# Patient Record
Sex: Female | Born: 1945 | Race: Black or African American | Hispanic: No | Marital: Single | Smoking: Never smoker
Health system: Southern US, Community
[De-identification: ages and names within clinical notes are randomized; demographics above are authoritative.]

## PROBLEM LIST (undated history)

## (undated) DIAGNOSIS — F0390 Unspecified dementia without behavioral disturbance: Secondary | ICD-10-CM

## (undated) DIAGNOSIS — F329 Major depressive disorder, single episode, unspecified: Secondary | ICD-10-CM

## (undated) DIAGNOSIS — I10 Essential (primary) hypertension: Secondary | ICD-10-CM

## (undated) DIAGNOSIS — E119 Type 2 diabetes mellitus without complications: Secondary | ICD-10-CM

## (undated) DIAGNOSIS — E785 Hyperlipidemia, unspecified: Secondary | ICD-10-CM

## (undated) HISTORY — PX: CYSTECTOMY: SUR359

## (undated) HISTORY — PX: ABDOMINAL HYSTERECTOMY: SHX81

---

## 2009-11-25 ENCOUNTER — Emergency Department (HOSPITAL_COMMUNITY): Admission: EM | Admit: 2009-11-25 | Discharge: 2009-11-26 | Payer: Self-pay | Admitting: Emergency Medicine

## 2009-12-06 ENCOUNTER — Emergency Department (HOSPITAL_COMMUNITY): Admission: EM | Admit: 2009-12-06 | Discharge: 2009-12-06 | Payer: Self-pay | Admitting: Emergency Medicine

## 2011-01-07 LAB — DIFFERENTIAL
Eosinophils Absolute: 0 10*3/uL (ref 0.0–0.7)
Monocytes Relative: 6 % (ref 3–12)
Neutro Abs: 4.2 10*3/uL (ref 1.7–7.7)
Neutrophils Relative %: 47 % (ref 43–77)

## 2011-01-07 LAB — URINE MICROSCOPIC-ADD ON

## 2011-01-07 LAB — URINALYSIS, ROUTINE W REFLEX MICROSCOPIC
Glucose, UA: NEGATIVE mg/dL
Hgb urine dipstick: NEGATIVE
Ketones, ur: 15 mg/dL — AB
Nitrite: NEGATIVE
Protein, ur: NEGATIVE mg/dL
Urobilinogen, UA: 1 mg/dL (ref 0.0–1.0)

## 2011-01-07 LAB — CBC
Hemoglobin: 14.1 g/dL (ref 12.0–15.0)
Platelets: 310 10*3/uL (ref 150–400)
RBC: 4.32 MIL/uL (ref 3.87–5.11)

## 2011-01-07 LAB — BASIC METABOLIC PANEL
BUN: 6 mg/dL (ref 6–23)
Chloride: 107 mEq/L (ref 96–112)
Potassium: 3.3 mEq/L — ABNORMAL LOW (ref 3.5–5.1)

## 2011-01-07 LAB — URINE CULTURE: Colony Count: 60000

## 2011-01-07 LAB — ETHANOL: Alcohol, Ethyl (B): <5 mg/dL (ref 0–10)

## 2011-05-25 IMAGING — CR DG CHEST 2V
2 series · 2 of 2 positions shown · non-contrast
Comparison: None

CLINICAL DATA: Altered mental status.

CHEST - 2 VIEW

[w chest pa]
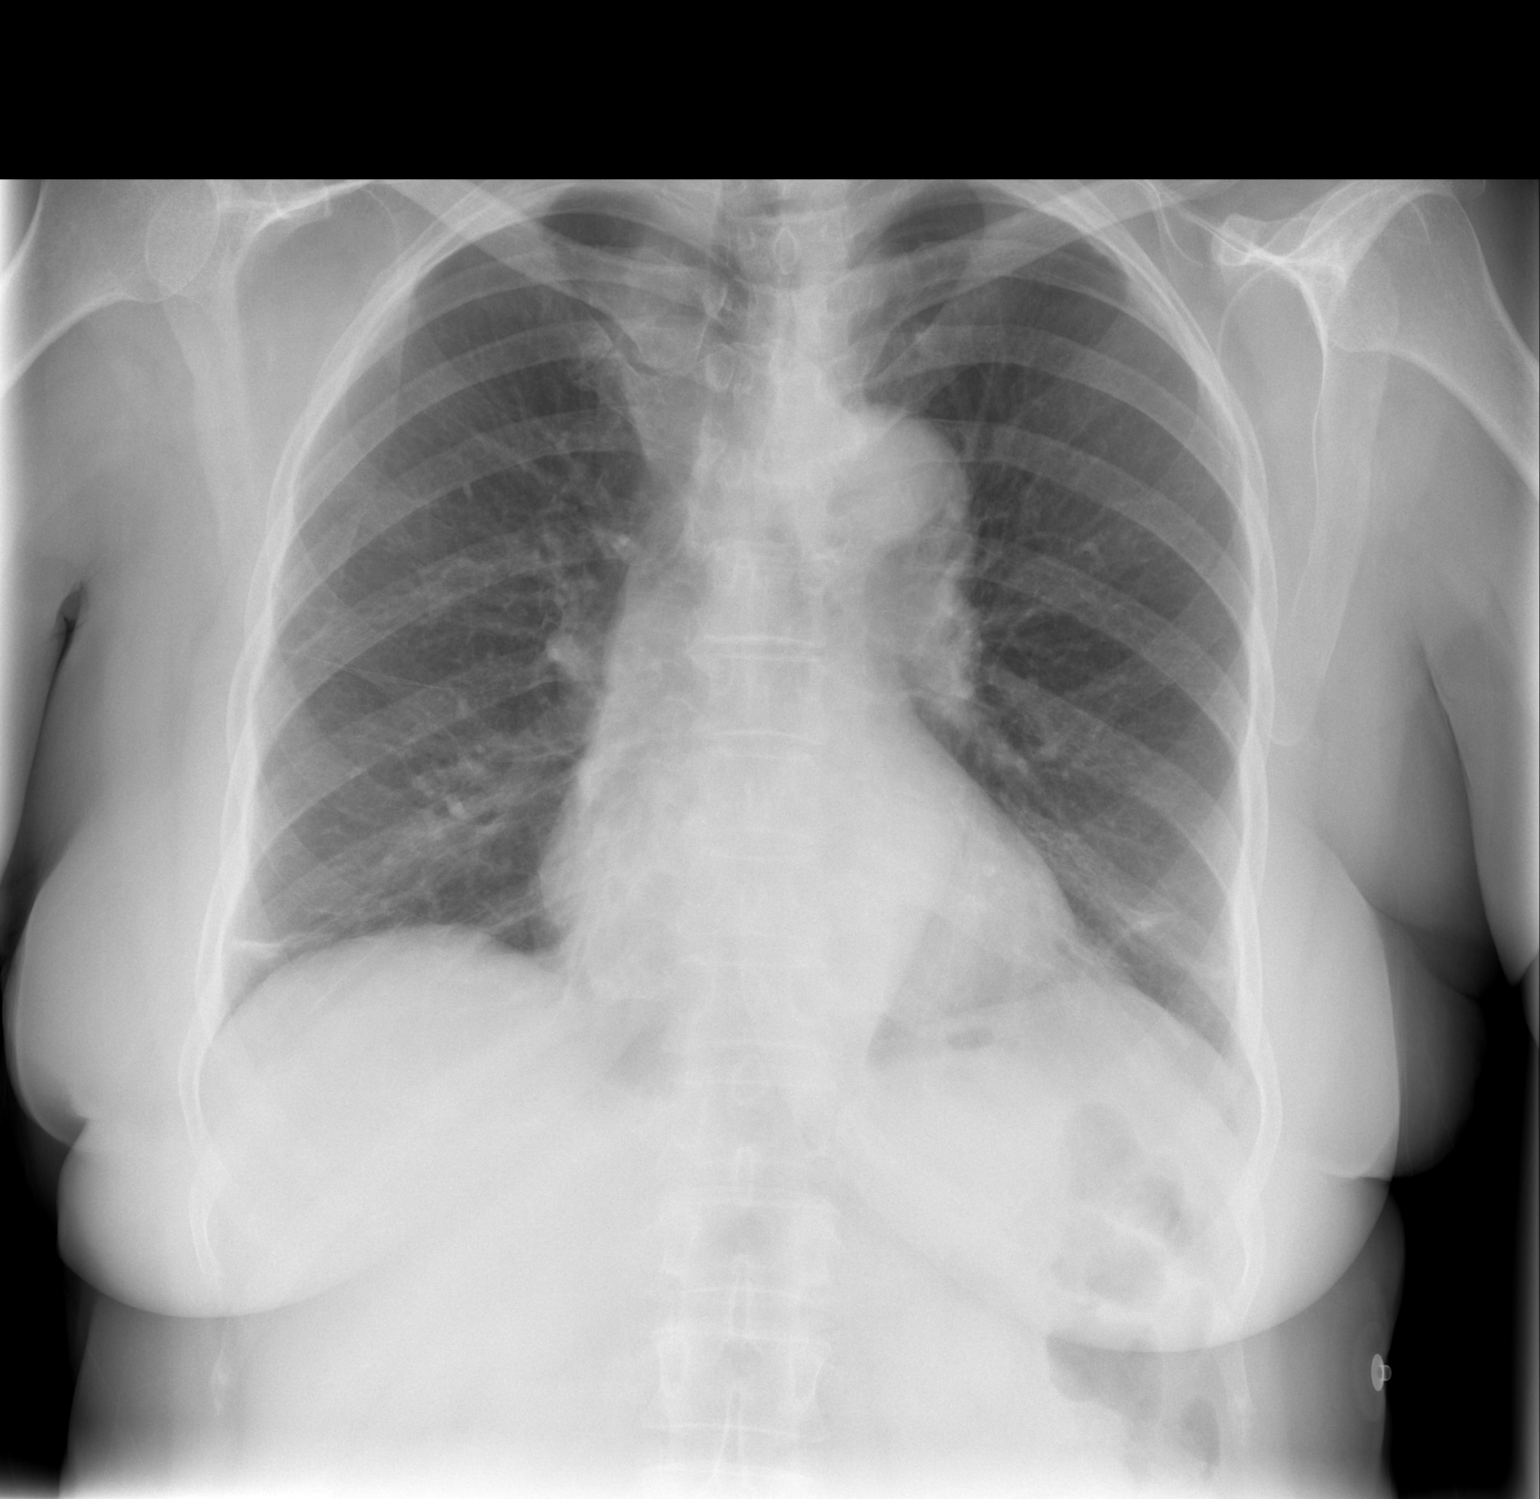

[w chest lat]
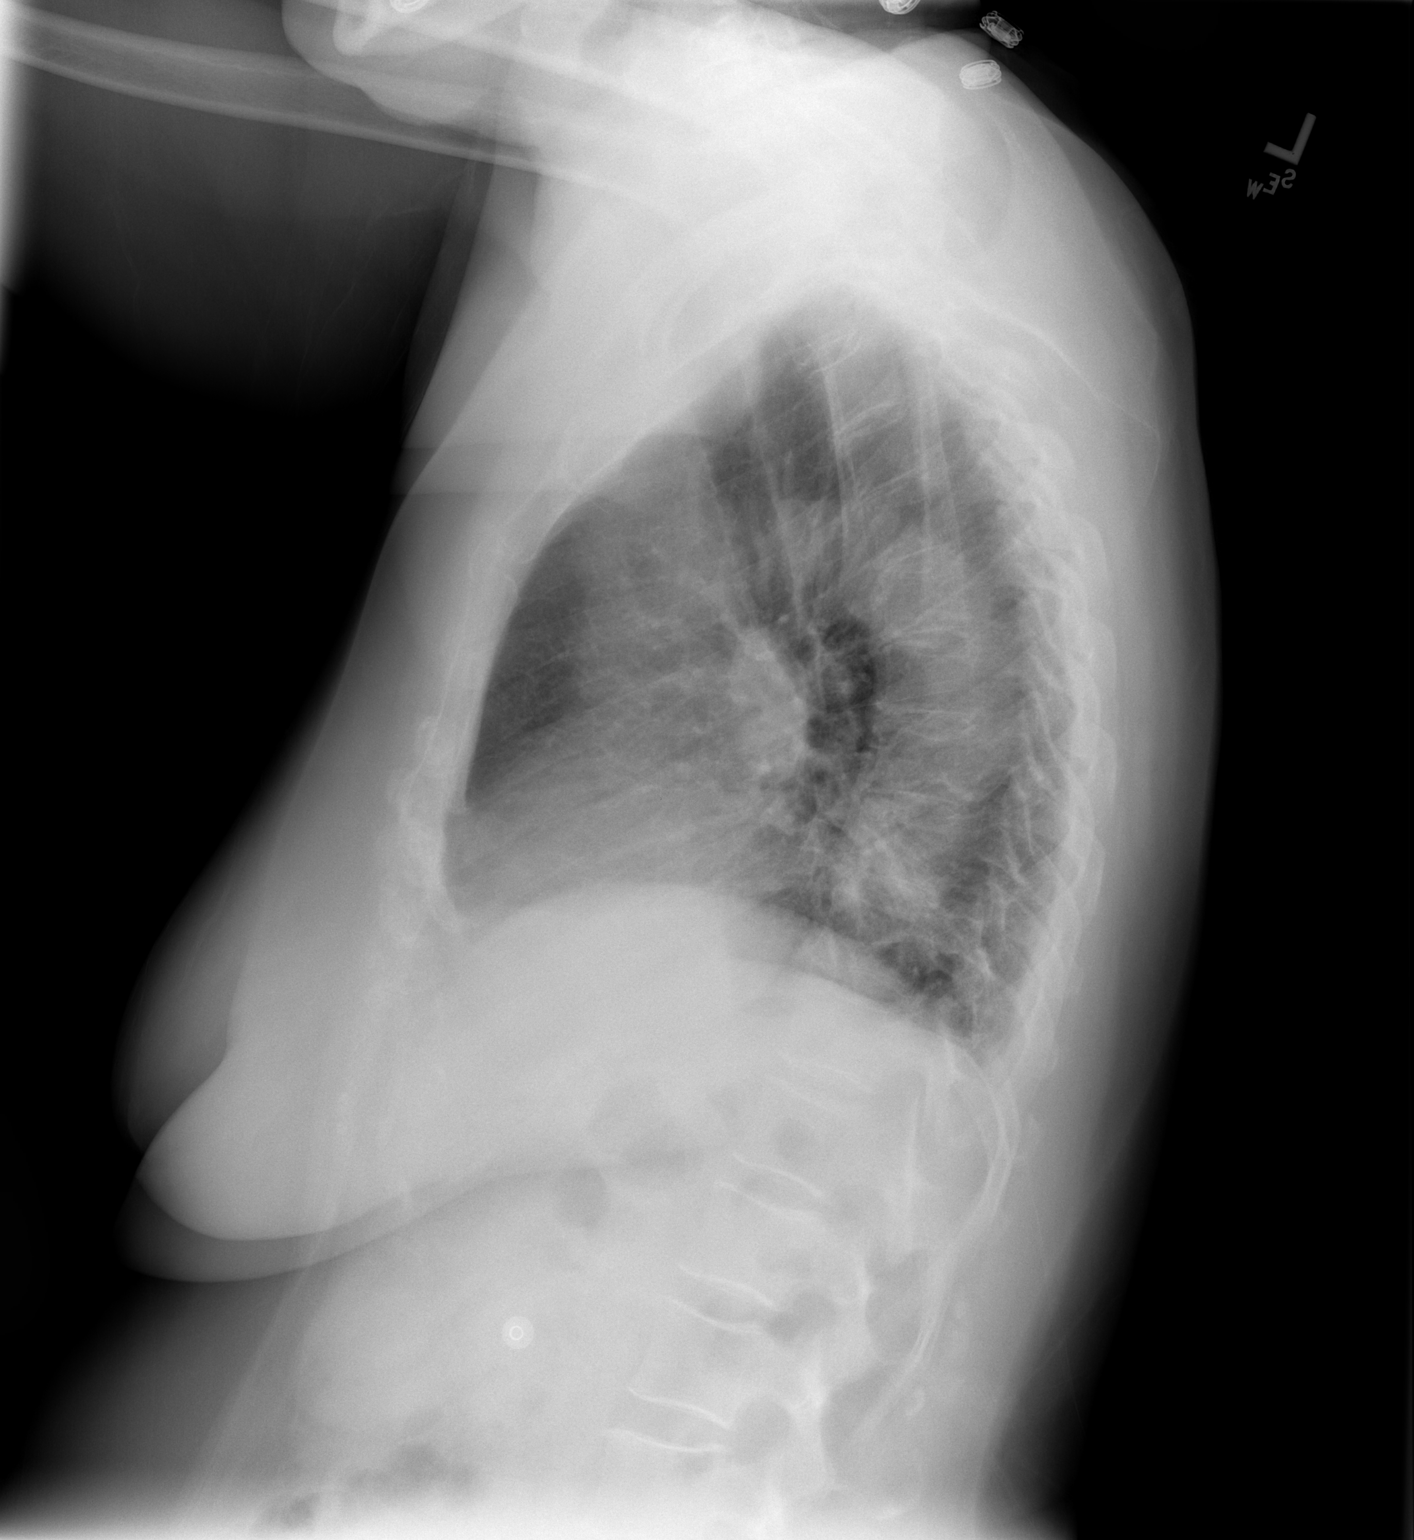

[2 of 2 positions shown; findings below may reference images not displayed]

FINDINGS: The lungs are well-aerated.  Mild bibasilar atelectasis
or scarring is noted.  There is no evidence of focal opacification,
pleural effusion or pneumothorax.

The heart is normal in size; the mediastinal contour is within
normal limits.  No acute osseous abnormalities are seen.
IMPRESSION: Mild bibasilar atelectasis or scarring noted.

## 2011-10-29 ENCOUNTER — Other Ambulatory Visit: Payer: Self-pay | Admitting: Family Medicine

## 2011-10-29 DIAGNOSIS — Z1231 Encounter for screening mammogram for malignant neoplasm of breast: Secondary | ICD-10-CM

## 2011-12-01 ENCOUNTER — Ambulatory Visit
Admission: RE | Admit: 2011-12-01 | Discharge: 2011-12-01 | Disposition: A | Discharge status: Home / Self Care | Payer: Medicare Other | Source: Ambulatory Visit | Attending: Family Medicine | Admitting: Family Medicine

## 2011-12-01 DIAGNOSIS — Z1231 Encounter for screening mammogram for malignant neoplasm of breast: Secondary | ICD-10-CM

## 2013-01-02 ENCOUNTER — Encounter (HOSPITAL_COMMUNITY): Payer: Self-pay | Admitting: Nurse Practitioner

## 2013-01-02 ENCOUNTER — Emergency Department (HOSPITAL_COMMUNITY)
Admission: EM | Admit: 2013-01-02 | Discharge: 2013-01-03 | Disposition: A | Discharge status: Skilled Nursing Facility | Payer: Medicare Other | Attending: Emergency Medicine | Admitting: Emergency Medicine

## 2013-01-02 DIAGNOSIS — R002 Palpitations: Secondary | ICD-10-CM | POA: Insufficient documentation

## 2013-01-02 DIAGNOSIS — Z79899 Other long term (current) drug therapy: Secondary | ICD-10-CM | POA: Insufficient documentation

## 2013-01-02 DIAGNOSIS — R739 Hyperglycemia, unspecified: Secondary | ICD-10-CM

## 2013-01-02 DIAGNOSIS — R110 Nausea: Secondary | ICD-10-CM | POA: Insufficient documentation

## 2013-01-02 DIAGNOSIS — F0280 Dementia in other diseases classified elsewhere without behavioral disturbance: Secondary | ICD-10-CM | POA: Insufficient documentation

## 2013-01-02 DIAGNOSIS — E1169 Type 2 diabetes mellitus with other specified complication: Secondary | ICD-10-CM | POA: Insufficient documentation

## 2013-01-02 DIAGNOSIS — E785 Hyperlipidemia, unspecified: Secondary | ICD-10-CM | POA: Insufficient documentation

## 2013-01-02 DIAGNOSIS — Z8659 Personal history of other mental and behavioral disorders: Secondary | ICD-10-CM | POA: Insufficient documentation

## 2013-01-02 DIAGNOSIS — Z7902 Long term (current) use of antithrombotics/antiplatelets: Secondary | ICD-10-CM | POA: Insufficient documentation

## 2013-01-02 DIAGNOSIS — G309 Alzheimer's disease, unspecified: Secondary | ICD-10-CM | POA: Insufficient documentation

## 2013-01-02 HISTORY — DX: Type 2 diabetes mellitus without complications: E11.9

## 2013-01-02 HISTORY — DX: Major depressive disorder, single episode, unspecified: F32.9

## 2013-01-02 HISTORY — DX: Hyperlipidemia, unspecified: E78.5

## 2013-01-02 HISTORY — DX: Unspecified dementia, unspecified severity, without behavioral disturbance, psychotic disturbance, mood disturbance, and anxiety: F03.90

## 2013-01-02 LAB — CBC
HCT: 41.9 % (ref 36.0–46.0)
Hemoglobin: 15.5 g/dL — ABNORMAL HIGH (ref 12.0–15.0)
MCH: 32.6 pg (ref 26.0–34.0)
MCHC: 37 g/dL — ABNORMAL HIGH (ref 30.0–36.0)
MCV: 88 fL (ref 78.0–100.0)
Platelets: 284 10*3/uL (ref 150–400)
RBC: 4.76 MIL/uL (ref 3.87–5.11)
RDW: 12.8 % (ref 11.5–15.5)
WBC: 9 10*3/uL (ref 4.0–10.5)

## 2013-01-02 LAB — BASIC METABOLIC PANEL
BUN: 10 mg/dL (ref 6–23)
CO2: 23 mEq/L (ref 19–32)
Calcium: 9.4 mg/dL (ref 8.4–10.5)
Chloride: 100 mEq/L (ref 96–112)
Creatinine, Ser: 0.77 mg/dL (ref 0.50–1.10)
GFR calc Af Amer: >90 mL/min (ref >90)
GFR calc non Af Amer: 87 mL/min — ABNORMAL LOW (ref >90)
Glucose, Bld: 257 mg/dL — ABNORMAL HIGH (ref 70–99)
Potassium: 3.6 mEq/L (ref 3.5–5.1)
Sodium: 139 mEq/L (ref 135–145)

## 2013-01-02 LAB — GLUCOSE, CAPILLARY: Glucose-Capillary: 229 mg/dL — ABNORMAL HIGH (ref 70–99)

## 2013-01-02 LAB — TROPONIN I: Troponin I: <0.3 ng/mL (ref <0.30)

## 2013-01-02 MED ORDER — METFORMIN HCL 500 MG PO TABS
500.0000 mg | ORAL_TABLET | Freq: Once | ORAL | Status: AC
  Administered 2013-01-02: 500 mg via ORAL
  Filled 2013-01-02: qty 1

## 2013-01-02 MED ORDER — METFORMIN HCL 500 MG PO TABS: 500.0000 mg | ORAL_TABLET | Freq: Two times a day (BID) | ORAL | Status: DC

## 2013-01-02 MED ORDER — SODIUM CHLORIDE 0.9 % IV BOLUS (SEPSIS)
1000.0000 mL | Freq: Once | INTRAVENOUS | Status: AC
  Administered 2013-01-02: 1000 mL via INTRAVENOUS

## 2013-01-02 NOTE — ED Notes (Signed)
[facility], states she has been having palpitations, last time earlier today.  She states she does not have them at this time.  Patient denies any shortness of breath, but is having pain in right neck, shoulder and arm.  Patient is CAOx3.

## 2013-01-02 NOTE — ED Notes (Signed)
Pt waiting on PTAR 

## 2013-01-02 NOTE — ED Provider Notes (Signed)
History     CSN: [id number]  Arrival date & time 01/02/13  1949   First MD Initiated Contact with Patient 01/02/13 2017      Chief Complaint  Patient presents with  . Palpitations    (Consider location/radiation/quality/duration/timing/severity/associated sxs/prior treatment) Patient is a 67 y.o. female presenting with palpitations.  Palpitations  This is a recurrent problem. Episode onset: 4 days ago. The problem occurs constantly ("let off" for a little while today, but then worsened again). The problem has not changed since onset.Associated with: "ever since my doctor took away my metformin" Associated symptoms include nausea ("my stomach has been torn up"). Pertinent negatives include no diaphoresis, no fever, no chest pain, no abdominal pain, no vomiting, no cough and no shortness of breath. She has tried nothing for the symptoms.    Past Medical History  Diagnosis Date  . Major depressive disorder   . 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 Hyperlipidemia   . Diabetes     type 2, uncomplicated    Past Surgical History  Procedure Laterality Date  . Cesarean section    . Abdominal hysterectomy    . Cystectomy Left     axillae    No family history on file.  History  Substance Use Topics  . Smoking status: Never Smoker   . Smokeless tobacco: Not on file  . Alcohol Use: No    OB History   Grav Para Term Preterm Abortions TAB SAB Ect Mult Living                  Review of Systems  Constitutional: Negative for fever and diaphoresis.  HENT: Negative for congestion.   Respiratory: Negative for cough and shortness of breath.   Cardiovascular: Positive for palpitations. Negative for chest pain.  Gastrointestinal: Positive for nausea ("my stomach has been torn up"). Negative for vomiting, abdominal pain and diarrhea.  Genitourinary: Negative for difficulty urinating.  All other systems reviewed and are negative.    Allergies  Codeine  Home Medications    Current Outpatient Rx  Name  Route  Sig  Dispense  Refill  . clopidogrel (PLAVIX) 75 MG tablet   Oral   Take 75 mg by mouth daily.         . famotidine (PEPCID) 20 MG tablet   Oral   Take 20 mg by mouth 2 (two) times daily.         Marland Kitchen levothyroxine (SYNTHROID, LEVOTHROID) 25 MCG tablet   Oral   Take 25 mcg by mouth daily.         Marland Kitchen losartan (COZAAR) 50 MG tablet   Oral   Take 50 mg by mouth daily.         . metoprolol (LOPRESSOR) 50 MG tablet   Oral   Take 50 mg by mouth 2 (two) times daily.         . pravastatin (PRAVACHOL) 80 MG tablet   Oral   Take 80 mg by mouth daily.           BP 176/83  Pulse 106  Temp(Src) 98.9 F (37.2 C) (Oral)  Resp 20  SpO2 100%  Physical Exam  Nursing note and vitals reviewed. Constitutional: She is oriented to person, place, and time. She appears well-developed and well-nourished. No distress.  HENT:  Head: Normocephalic and atraumatic.  Mouth/Throat: Oropharynx is clear and moist.  Eyes: Conjunctivae are normal. Pupils are equal, round, and reactive to light. No scleral  icterus.  Neck: Neck supple.  Cardiovascular: Regular rhythm, normal heart sounds and intact distal pulses.  Tachycardia present.   No murmur heard. Pulmonary/Chest: Effort normal and breath sounds normal. No stridor. No respiratory distress. She has no rales.  Abdominal: Soft. Bowel sounds are normal. She exhibits no distension. There is no tenderness.  Musculoskeletal: Normal range of motion.  Neurological: She is alert and oriented to person, place, and time.  Skin: Skin is warm and dry. No rash noted.  Psychiatric: She has a normal mood and affect. Her behavior is normal.    ED Course  Procedures (including critical care time)  Labs Reviewed  BASIC METABOLIC PANEL - Abnormal; Notable for the following:    Glucose, Bld 257 (*)    GFR calc non Af Amer 87 (*)    All other components within normal limits  CBC - Abnormal; Notable for the  following:    Hemoglobin 15.5 (*)    MCHC 37.0 (*)    All other components within normal limits  GLUCOSE, CAPILLARY - Abnormal; Notable for the following:    Glucose-Capillary 229 (*)    All other components within normal limits  TROPONIN I   No results found.   Date: 01/03/2013  Rate: 102  Rhythm: sinus tachycardia  QRS Axis: normal  Intervals: normal  ST/T Wave abnormalities: normal  Conduction Disutrbances:none  Narrative Interpretation:   Old EKG Reviewed: none available   1. Palpitations   2. Hyperglycemia       MDM  67 yo female presenting with complaint of palpitations for several days duration this episode, but occuring frequently over the past year.  She believes these are because she was taken off of metformin and still thinks she needs it.  No chest pain, ecg shows mild sinus tachy, well appearing, and asymptomatic in ED.  Labwork unremarkable except for hyperglycemia.  Do not think she needs emergent or inpatient workup, but have referred her to her PCP for further evaluation.  Also restarted her metformin secondary to her hyperglycemia.          Rennis Petty, MD 01/03/13 [phone]

## 2013-01-02 NOTE — ED Notes (Signed)
Report called to [facility], [place], [place]

## 2013-01-02 NOTE — ED Notes (Signed)
Per EMS pt from [facility] c/o palpitations "feels like its beating out of my test" 45 min ago when she was on the phone. Pt std "I don't feel good" denies palpitation currently. EKG- sinus tach on monitor HR 112 has hx of dementia, diabetes CBG 274 O2 sats 99/RA denies SOB, n/v, CP.

## 2013-01-04 NOTE — ED Provider Notes (Signed)
I saw and evaluated the patient, reviewed the resident's note and I agree with the findings and plan.  I personally reviewed the pt's EKG.   67 year old female with palpitations. EKG unremarkable aside from mild sinus tachycardia. On exam she is in no acute distress. Lungs are clear. No precervical breathing. Heart is regular without murmur. Abdomen is benign.Lower extremities symmetric as compared to each other. No calf tenderness. Negative Homan's. No palpable cords. Will workup significant for hyperglycemia. Patient Nedra Hai previously and metformin which she seemed to tolerate well. Renal function is okay. Patient was provided with prescription for this. Discussed the need for close outpatient followup.  Raeford Razor, MD 01/04/13 [phone]

## 2013-01-10 ENCOUNTER — Other Ambulatory Visit: Payer: Self-pay | Admitting: Family Medicine

## 2013-01-10 DIAGNOSIS — Z1231 Encounter for screening mammogram for malignant neoplasm of breast: Secondary | ICD-10-CM

## 2013-02-07 ENCOUNTER — Ambulatory Visit
Admission: RE | Admit: 2013-02-07 | Discharge: 2013-02-07 | Disposition: A | Discharge status: Home / Self Care | Payer: Medicare Other | Source: Ambulatory Visit | Attending: Family Medicine | Admitting: Family Medicine

## 2013-02-07 DIAGNOSIS — Z1231 Encounter for screening mammogram for malignant neoplasm of breast: Secondary | ICD-10-CM

## 2013-11-13 ENCOUNTER — Emergency Department (HOSPITAL_COMMUNITY)
Admission: EM | Admit: 2013-11-13 | Discharge: 2013-11-13 | Disposition: A | Discharge status: Home / Self Care | Payer: Medicare Other | Attending: Emergency Medicine | Admitting: Emergency Medicine

## 2013-11-13 ENCOUNTER — Encounter (HOSPITAL_COMMUNITY): Payer: Self-pay | Admitting: Emergency Medicine

## 2013-11-13 ENCOUNTER — Emergency Department (HOSPITAL_COMMUNITY): Payer: Medicare Other

## 2013-11-13 DIAGNOSIS — E785 Hyperlipidemia, unspecified: Secondary | ICD-10-CM | POA: Insufficient documentation

## 2013-11-13 DIAGNOSIS — E119 Type 2 diabetes mellitus without complications: Secondary | ICD-10-CM | POA: Insufficient documentation

## 2013-11-13 DIAGNOSIS — Z79899 Other long term (current) drug therapy: Secondary | ICD-10-CM | POA: Insufficient documentation

## 2013-11-13 DIAGNOSIS — R071 Chest pain on breathing: Secondary | ICD-10-CM | POA: Insufficient documentation

## 2013-11-13 DIAGNOSIS — R0789 Other chest pain: Secondary | ICD-10-CM

## 2013-11-13 DIAGNOSIS — G309 Alzheimer's disease, unspecified: Secondary | ICD-10-CM | POA: Insufficient documentation

## 2013-11-13 DIAGNOSIS — I10 Essential (primary) hypertension: Secondary | ICD-10-CM | POA: Insufficient documentation

## 2013-11-13 DIAGNOSIS — J069 Acute upper respiratory infection, unspecified: Secondary | ICD-10-CM | POA: Insufficient documentation

## 2013-11-13 DIAGNOSIS — F0280 Dementia in other diseases classified elsewhere without behavioral disturbance: Secondary | ICD-10-CM | POA: Insufficient documentation

## 2013-11-13 HISTORY — DX: Essential (primary) hypertension: I10

## 2013-11-13 LAB — CBC WITH DIFFERENTIAL/PLATELET
Basophils Absolute: 0 10*3/uL (ref 0.0–0.1)
Basophils Relative: 0 % (ref 0–1)
Eosinophils Absolute: 0.1 10*3/uL (ref 0.0–0.7)
Eosinophils Relative: 1 % (ref 0–5)
HCT: 38 % (ref 36.0–46.0)
Hemoglobin: 13.4 g/dL (ref 12.0–15.0)
Lymphocytes Relative: 38 % (ref 12–46)
Lymphs Abs: 2.9 10*3/uL (ref 0.7–4.0)
MCH: 31.5 pg (ref 26.0–34.0)
MCHC: 35.3 g/dL (ref 30.0–36.0)
MCV: 89.4 fL (ref 78.0–100.0)
Monocytes Absolute: 0.5 10*3/uL (ref 0.1–1.0)
Monocytes Relative: 6 % (ref 3–12)
Neutro Abs: 4.2 10*3/uL (ref 1.7–7.7)
Neutrophils Relative %: 55 % (ref 43–77)
Platelets: ADEQUATE 10*3/uL (ref 150–400)
RBC: 4.25 MIL/uL (ref 3.87–5.11)
RDW: 13.7 % (ref 11.5–15.5)
WBC: 7.7 10*3/uL (ref 4.0–10.5)

## 2013-11-13 LAB — URINALYSIS, ROUTINE W REFLEX MICROSCOPIC
Bilirubin Urine: NEGATIVE
Glucose, UA: NEGATIVE mg/dL
Hgb urine dipstick: NEGATIVE
Ketones, ur: NEGATIVE mg/dL
Nitrite: NEGATIVE
Protein, ur: NEGATIVE mg/dL
Specific Gravity, Urine: 1.015 (ref 1.005–1.030)
Urobilinogen, UA: 0.2 mg/dL (ref 0.0–1.0)
pH: 6 (ref 5.0–8.0)

## 2013-11-13 LAB — BASIC METABOLIC PANEL
BUN: 8 mg/dL (ref 6–23)
CO2: 22 mEq/L (ref 19–32)
Calcium: 8.8 mg/dL (ref 8.4–10.5)
Chloride: 107 mEq/L (ref 96–112)
Creatinine, Ser: 0.83 mg/dL (ref 0.50–1.10)
GFR calc Af Amer: 84 mL/min — ABNORMAL LOW (ref >90)
GFR calc non Af Amer: 72 mL/min — ABNORMAL LOW (ref >90)
Glucose, Bld: 94 mg/dL (ref 70–99)
Potassium: 3.8 mEq/L (ref 3.7–5.3)
Sodium: 145 mEq/L (ref 137–147)

## 2013-11-13 LAB — URINE MICROSCOPIC-ADD ON

## 2013-11-13 LAB — TROPONIN I
Troponin I: <0.3 ng/mL (ref <0.30)
Troponin I: <0.3 ng/mL (ref <0.30)

## 2013-11-13 MED ORDER — ASPIRIN 81 MG PO CHEW: 324.0000 mg | CHEWABLE_TABLET | Freq: Once | ORAL | Status: DC

## 2013-11-13 MED ORDER — MORPHINE SULFATE 4 MG/ML IJ SOLN
4.0000 mg | Freq: Once | INTRAMUSCULAR | Status: AC
  Administered 2013-11-13: 4 mg via INTRAVENOUS
  Filled 2013-11-13: qty 1

## 2013-11-13 MED ORDER — PROMETHAZINE-DM 6.25-15 MG/5ML PO SYRP: 5.0000 mL | ORAL_SOLUTION | Freq: Four times a day (QID) | ORAL | Status: AC | PRN

## 2013-11-13 MED ORDER — GUAIFENESIN ER 1200 MG PO TB12: 1.0000 | ORAL_TABLET | Freq: Two times a day (BID) | ORAL | Status: AC

## 2013-11-13 MED ORDER — IBUPROFEN 800 MG PO TABS: 800.0000 mg | ORAL_TABLET | Freq: Three times a day (TID) | ORAL | Status: AC | PRN

## 2013-11-13 NOTE — ED Notes (Signed)
Pt.'s son's phone number Caryn BeeKevin [phone]) (c).

## 2013-11-13 NOTE — ED Notes (Signed)
Spoke with pt's son Caryn BeeKevin he will be here "in 30 minutes to pick her up."

## 2013-11-13 NOTE — Discharge Instructions (Signed)
Return here as needed. Follow up with your primary doctor for a recheck.  °

## 2013-11-13 NOTE — ED Notes (Signed)
Called lab for Troponin update.  Awaiting return phone call.

## 2013-11-13 NOTE — ED Notes (Signed)
Patient transported to X-ray 

## 2013-11-13 NOTE — ED Notes (Signed)
Per EMS, patient has been having chest pressure over a week.  [facility] and nothing found.   Patient states pressure in L chest today with no radiation and no other symptoms.  Patient was given 324mg  ASA en route to hospital by EMS.   Patient had 20 L hand placed by EMS.

## 2013-11-13 NOTE — ED Notes (Signed)
Lab states they are running the troponin now

## 2013-11-13 NOTE — ED Provider Notes (Signed)
CSN: [id number]     Arrival date & time 11/13/13  4098 History   First MD Initiated Contact with Patient 11/13/13 [phone]     Chief Complaint  Patient presents with  . Chest Pain   (Consider location/radiation/quality/duration/timing/severity/associated sxs/prior Treatment) HPI April Thomas is a 68 yo AA female with a PMH og DM, HTN, and hyperlipidemia who presents to the ED with palpitations and associated CP. The palpitations lasted ~20 minutes after she woke up. Her CP was located substernal, as well as, over her left lateral chest. Other assocaited symptoms include productive cough, weakness and feeling dizzy / lightheaded. April Thomas denies SOB, diaphoresis, back pain, weakness, dizziness, headache N/V, abdominal pain, paresthesias, LOC, and any focal neuro deficits. She denies tobacco, alcohol, or drug abuse.   Past Medical History  Diagnosis Date  . Major depressive disorder   . 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 Hyperlipidemia   . Diabetes     type 2, uncomplicated  . Hypertension    Past Surgical History  Procedure Laterality Date  . Cesarean section    . Abdominal hysterectomy    . Cystectomy Left     axillae   No family history on file. History  Substance Use Topics  . Smoking status: Never Smoker   . Smokeless tobacco: Not on file  . Alcohol Use: No   OB History   Grav Para Term Preterm Abortions TAB SAB Ect Mult Living                 Review of Systems All other systems negative except as documented in the HPI. All pertinent positives and negatives as reviewed in the HPI. Allergies  Codeine  Home Medications   Current Outpatient Rx  Name  Route  Sig  Dispense  Refill  . metFORMIN (GLUCOPHAGE) 500 MG tablet   Oral   Take 500 mg by mouth daily.         . metoprolol (LOPRESSOR) 50 MG tablet   Oral   Take 50 mg by mouth 2 (two) times daily.         . clopidogrel (PLAVIX) 75 MG tablet   Oral   Take 75 mg by mouth daily.         . famotidine  (PEPCID) 20 MG tablet   Oral   Take 20 mg by mouth 2 (two) times daily.         Marland Kitchen levothyroxine (SYNTHROID, LEVOTHROID) 25 MCG tablet   Oral   Take 25 mcg by mouth daily.         Marland Kitchen losartan (COZAAR) 50 MG tablet   Oral   Take 50 mg by mouth daily.         . pravastatin (PRAVACHOL) 80 MG tablet   Oral   Take 80 mg by mouth daily.          BP 136/70  Pulse 62  Temp(Src) 98.7 F (37.1 C) (Oral)  Resp 18  Ht 5\' 2"  (1.575 m)  Wt 180 lb (81.647 kg)  BMI 32.91 kg/m2  SpO2 98% Physical Exam  Constitutional: She is oriented to person, place, and time. She appears well-developed and well-nourished. No distress.  Cardiovascular: Normal rate, regular rhythm, normal heart sounds and intact distal pulses.  Exam reveals no gallop and no friction rub.   No murmur heard. Chest was TTP over sternum.  Pulmonary/Chest: Effort normal and breath sounds normal. No respiratory distress. She has no wheezes. She has no  rales.  Abdominal: Soft. Bowel sounds are normal. She exhibits no mass. There is no tenderness.  Neurological: She is alert and oriented to person, place, and time. No cranial nerve deficit.  Skin: She is not diaphoretic.  Psychiatric:  Flat affect.    ED Course  Procedures (including critical care time) Labs Review Labs Reviewed  BASIC METABOLIC PANEL - Abnormal; Notable for the following:    GFR calc non Af Amer 72 (*)    GFR calc Af Amer 84 (*)    All other components within normal limits  CBC WITH DIFFERENTIAL  TROPONIN I  URINALYSIS, ROUTINE W REFLEX MICROSCOPIC   Imaging Review No results found.  EKG Interpretation    Date/Time:  Tuesday November 13 2013 09:02:08 EST Ventricular Rate:  67 PR Interval:  165 QRS Duration: 79 QT Interval:  396 QTC Calculation: 418 R Axis:   30 Text Interpretation:  Sinus rhythm Abnormal R-wave progression, early transition No significant change since last tracing Confirmed by HORTON  MD, COURTNEY ([id number]) on 11/13/2013  9:10:28 AM            MDM  No diagnosis found. [name] presents with palpitations, CP, and weakness this AM upon awakening. She has not had any CP since arriving to the ED. EKG demonstrated SR (60's) without ST changes. Negative Troponin I. No anemia or leukocytosis on CBC.   Patient is still doing well (1440). No CP, palpitations, cough.  Patient has been evaluated x2 for this chest discomfort, but mainly occurs with palpation and coughing.  Patient be referred back to her primary care Dr. told to return here as needed.  Patient been seen multiple times for this chest pain is most likely chest wall pain from coughing, based on the fact, that she's had a recent cough, and continues with this.  She states that her pain is worse with coughing.  Carlyle Dollyhristopher W Georgie Eduardo, PA-C 11/15/13 0801  Carlyle Dollyhristopher W Mackena Plummer, PA-C 11/15/13 [phone]

## 2013-11-13 NOTE — ED Notes (Signed)
Attempted to draw pts labs was unsuccessful  

## 2013-11-17 NOTE — ED Provider Notes (Signed)
Medical screening examination/treatment/procedure(s) were conducted as a shared visit with non-physician practitioner(s) and myself.  I personally evaluated the patient during the encounter.  EKG Interpretation    Date/Time:  Tuesday November 13 2013 09:02:08 EST Ventricular Rate:  67 PR Interval:  165 QRS Duration: 79 QT Interval:  396 QTC Calculation: 418 R Axis:   30 Text Interpretation:  Sinus rhythm Abnormal R-wave progression, early transition No significant change since last tracing Confirmed by Levell Tavano  MD, Revis Whalin ([id number]) on 11/13/2013 9:10:28 AM            Patient presents with chest pain upon awakening. Somewhat atypical in nature and reproducible. EKG is reassuring and troponin negative.  Workup is largely unremarkable. Patient's son is at the bedside. He has concerns about his mother being off her medication. She is made multiple calls to EMS and has been the hospital multiple times because the son states that she gets "nervous." Patient is awake, alert, and oriented. Patient has outpatient psychiatrist is evaluating her for dementia at this time. I discussed with the son that this is likely the best option for further evaluation regarding the patient's mental status and early dementia. Son stated understanding.  After history, exam, and medical workup I feel the patient has been appropriately medically screened and is safe for discharge home. Pertinent diagnoses were discussed with the patient. Patient was given return precautions.   Shon Batonourtney F Benzion Mesta, MD 11/17/13 [phone]

## 2015-05-13 IMAGING — CR DG CHEST 2V
2 series · 2 of 2 positions shown · non-contrast
Comparison: DG CHEST PORTABLE dated 11/11/2013; DG CHEST PORTABLE
dated 10/23/2013; DG CHEST PORTABLE dated 11/03/2013

CLINICAL DATA: Short of breath.  Weakness.  Confusion.  Diabetes.

EXAM:
CHEST  2 VIEW

[w chest lat]
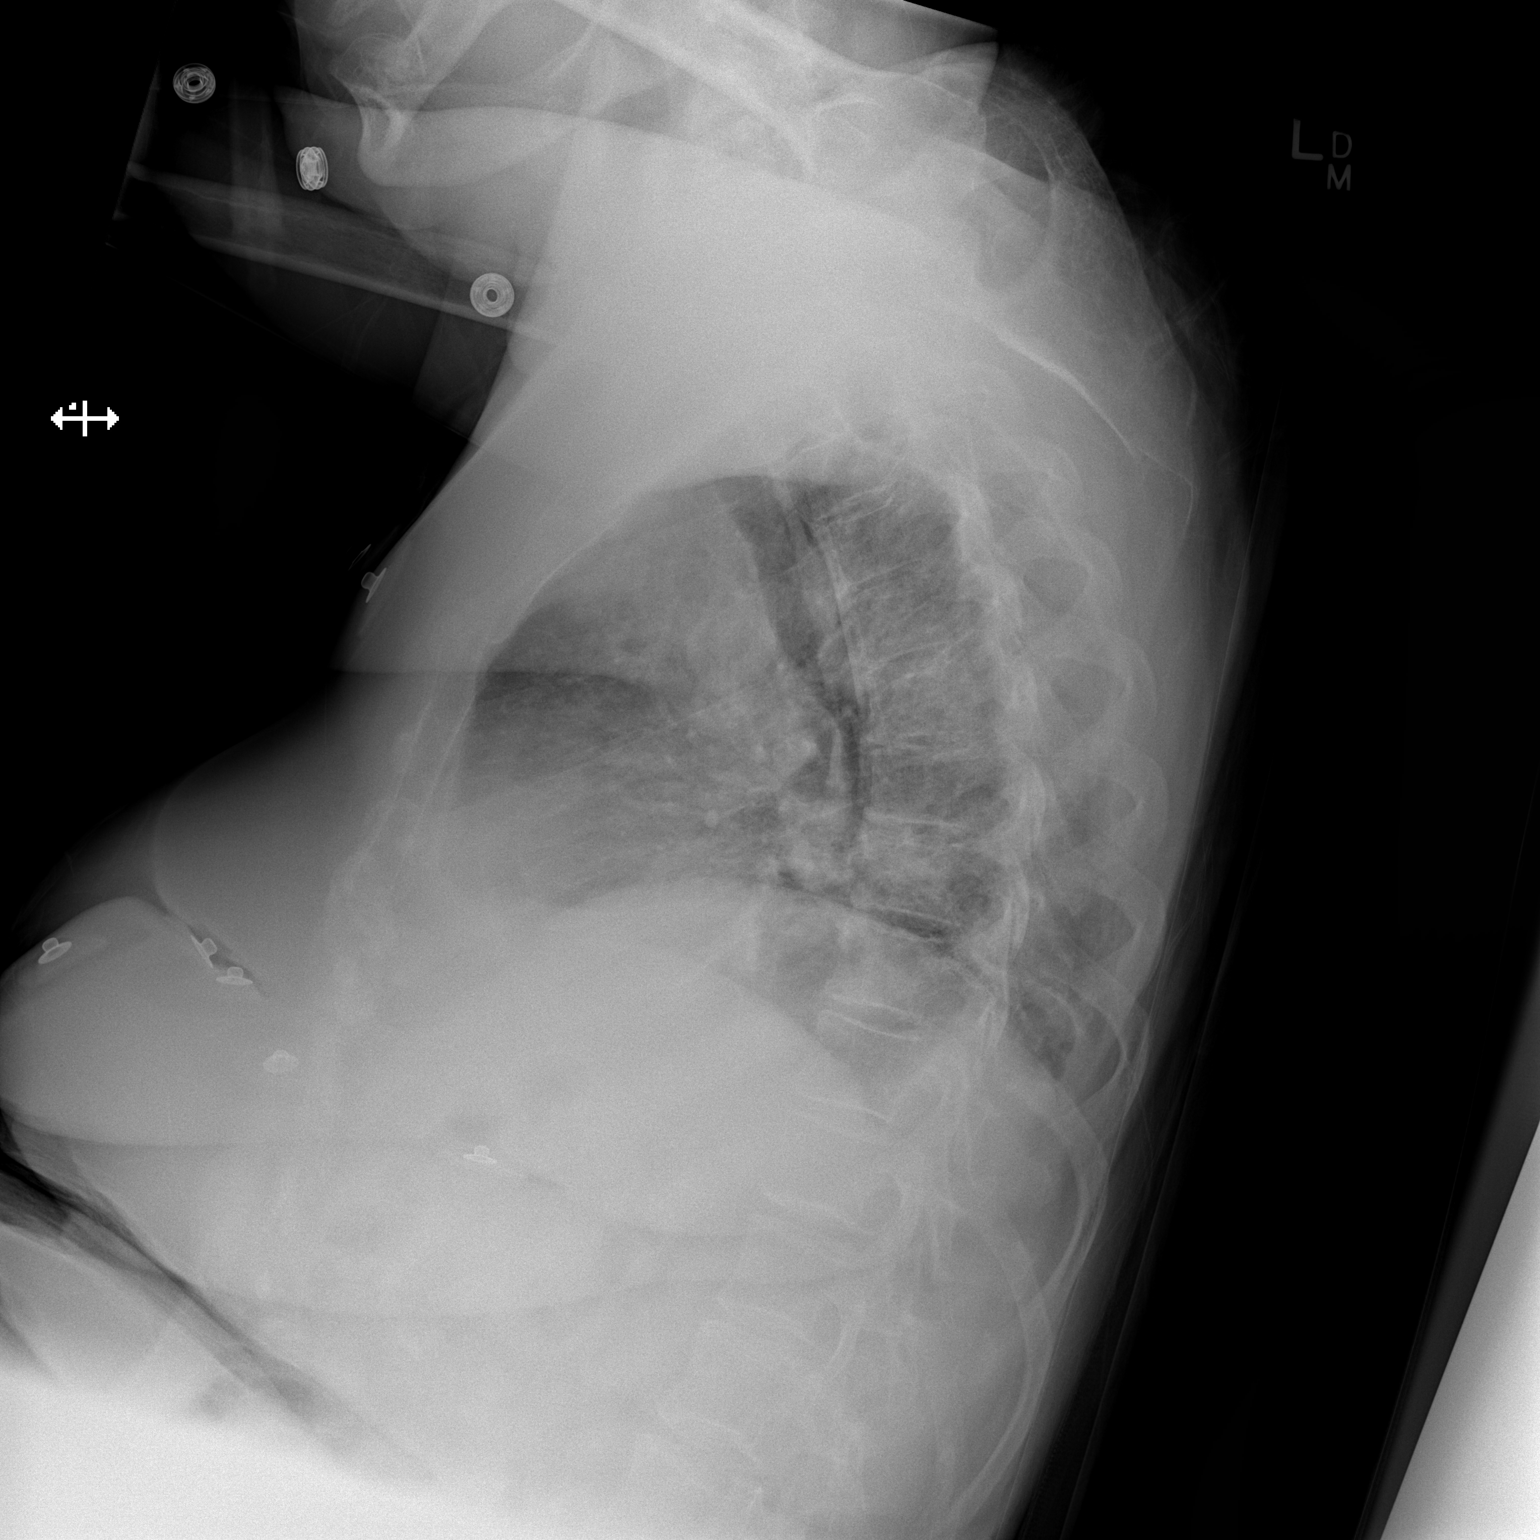

[x chest ap]
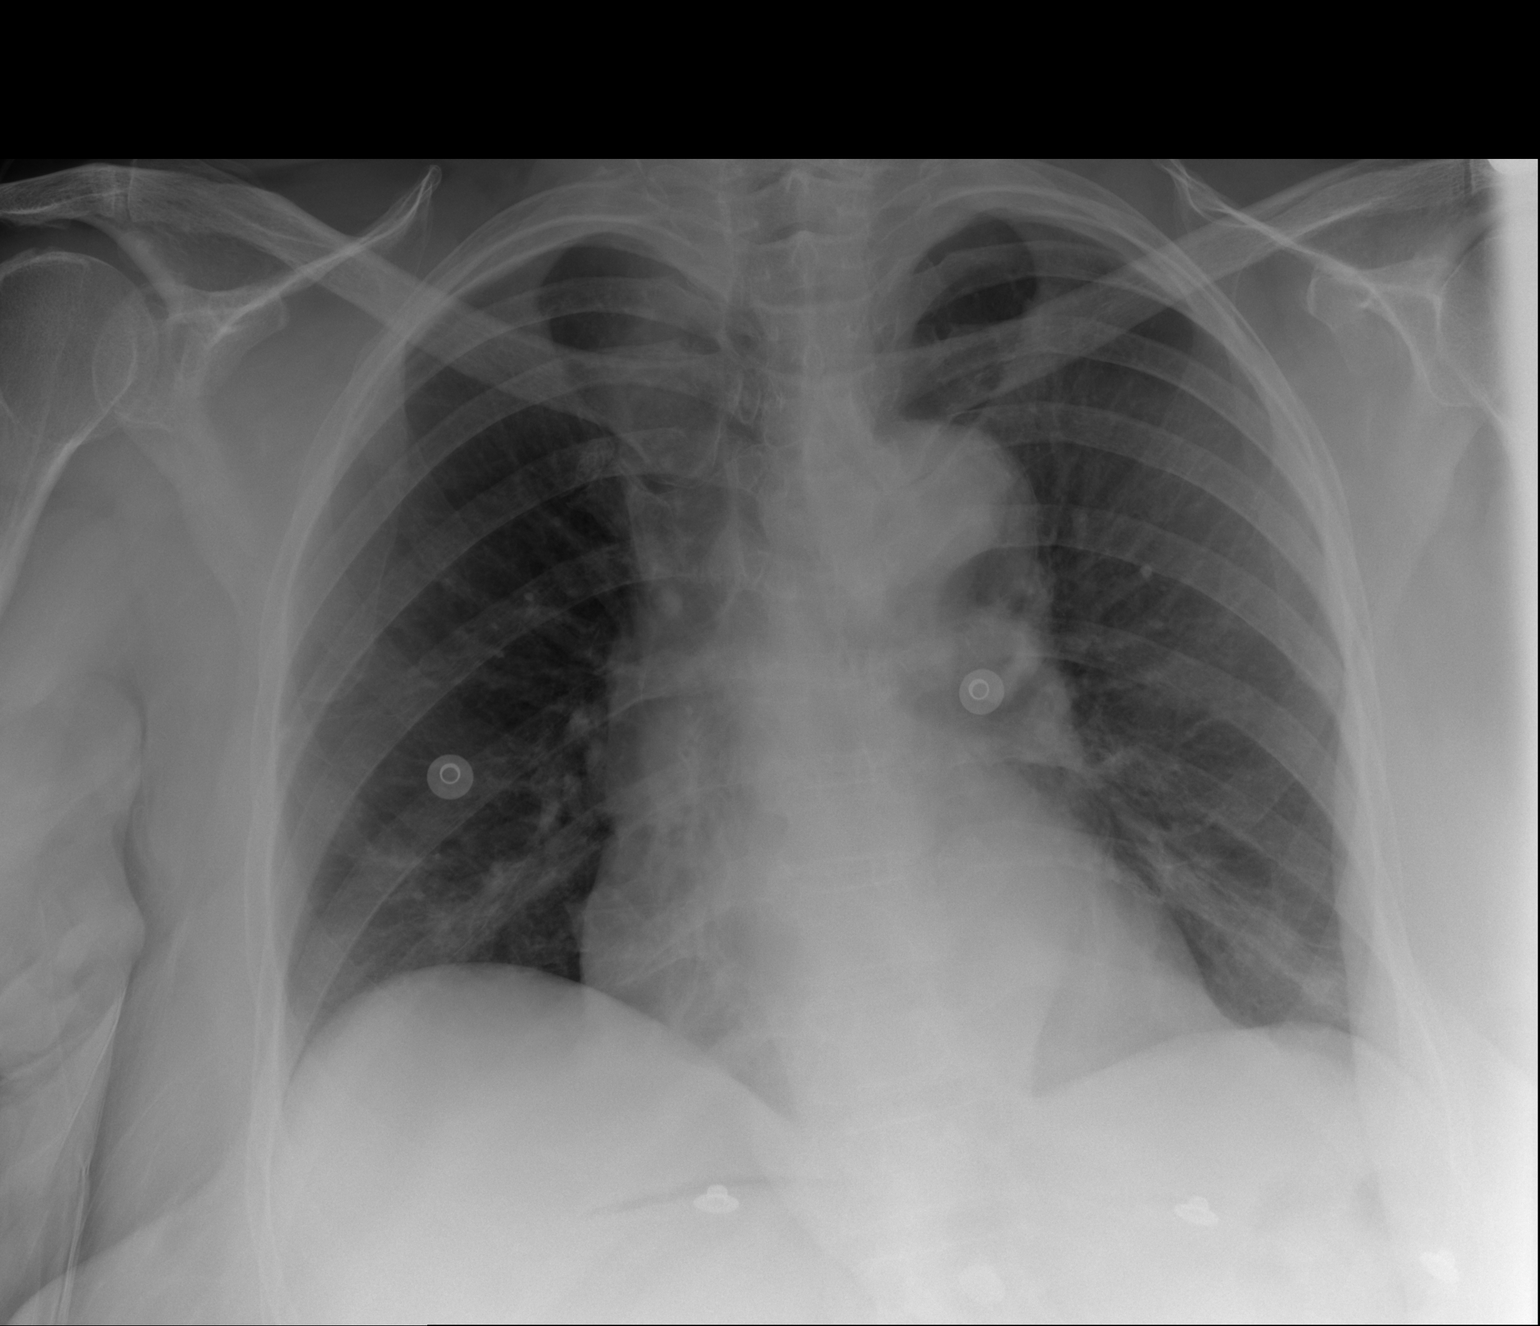

[2 of 2 positions shown; findings below may reference images not displayed]

FINDINGS: Cardiopericardial silhouette within normal limits for projection.
Tortuous thoracic aorta. Basilar atelectasis is present. There is no
airspace disease or pleural effusion identified. Patient is mildly
rotated on the frontal view, which distorts the appearance of the
aortic arch which is probably unchanged. Monitoring buttons project
over the chest.
IMPRESSION: No acute cardiopulmonary disease.
